# Patient Record
Sex: Male | Born: 1995
Health system: Southern US, Community
[De-identification: ages and names within clinical notes are randomized; demographics above are authoritative.]

## PROBLEM LIST (undated history)

## (undated) DIAGNOSIS — F909 Attention-deficit hyperactivity disorder, unspecified type: Secondary | ICD-10-CM

## (undated) DIAGNOSIS — F419 Anxiety disorder, unspecified: Secondary | ICD-10-CM

## (undated) DIAGNOSIS — L0100 Impetigo, unspecified: Secondary | ICD-10-CM

## (undated) DIAGNOSIS — L509 Urticaria, unspecified: Secondary | ICD-10-CM

## (undated) HISTORY — DX: Urticaria, unspecified: L50.9

## (undated) HISTORY — PX: TONSILLECTOMY: SUR1361

---
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
## 2016-08-16 DIAGNOSIS — F3340 Major depressive disorder, recurrent, in remission, unspecified: Secondary | ICD-10-CM | POA: Diagnosis not present

## 2016-08-16 DIAGNOSIS — F411 Generalized anxiety disorder: Secondary | ICD-10-CM | POA: Diagnosis not present

## 2016-09-10 DIAGNOSIS — R197 Diarrhea, unspecified: Secondary | ICD-10-CM | POA: Diagnosis not present

## 2016-09-10 DIAGNOSIS — R0982 Postnasal drip: Secondary | ICD-10-CM | POA: Diagnosis not present

## 2016-09-10 DIAGNOSIS — J111 Influenza due to unidentified influenza virus with other respiratory manifestations: Secondary | ICD-10-CM | POA: Diagnosis not present

## 2016-09-10 DIAGNOSIS — J069 Acute upper respiratory infection, unspecified: Secondary | ICD-10-CM | POA: Diagnosis not present

## 2016-09-20 DIAGNOSIS — F3340 Major depressive disorder, recurrent, in remission, unspecified: Secondary | ICD-10-CM | POA: Diagnosis not present

## 2016-09-20 DIAGNOSIS — F411 Generalized anxiety disorder: Secondary | ICD-10-CM | POA: Diagnosis not present

## 2016-09-24 DIAGNOSIS — F411 Generalized anxiety disorder: Secondary | ICD-10-CM | POA: Diagnosis not present

## 2016-09-24 DIAGNOSIS — F3340 Major depressive disorder, recurrent, in remission, unspecified: Secondary | ICD-10-CM | POA: Diagnosis not present

## 2016-10-01 DIAGNOSIS — F3340 Major depressive disorder, recurrent, in remission, unspecified: Secondary | ICD-10-CM | POA: Diagnosis not present

## 2016-10-01 DIAGNOSIS — F411 Generalized anxiety disorder: Secondary | ICD-10-CM | POA: Diagnosis not present

## 2016-10-05 DIAGNOSIS — R509 Fever, unspecified: Secondary | ICD-10-CM | POA: Diagnosis not present

## 2016-10-05 DIAGNOSIS — R109 Unspecified abdominal pain: Secondary | ICD-10-CM | POA: Diagnosis not present

## 2016-10-05 DIAGNOSIS — J069 Acute upper respiratory infection, unspecified: Secondary | ICD-10-CM | POA: Diagnosis not present

## 2016-10-15 DIAGNOSIS — F3340 Major depressive disorder, recurrent, in remission, unspecified: Secondary | ICD-10-CM | POA: Diagnosis not present

## 2016-10-15 DIAGNOSIS — F411 Generalized anxiety disorder: Secondary | ICD-10-CM | POA: Diagnosis not present

## 2016-10-27 DIAGNOSIS — F3170 Bipolar disorder, currently in remission, most recent episode unspecified: Secondary | ICD-10-CM | POA: Diagnosis not present

## 2016-10-27 DIAGNOSIS — Z79899 Other long term (current) drug therapy: Secondary | ICD-10-CM | POA: Diagnosis not present

## 2016-10-29 DIAGNOSIS — F3340 Major depressive disorder, recurrent, in remission, unspecified: Secondary | ICD-10-CM | POA: Diagnosis not present

## 2016-10-29 DIAGNOSIS — F411 Generalized anxiety disorder: Secondary | ICD-10-CM | POA: Diagnosis not present

## 2016-11-09 DIAGNOSIS — F3170 Bipolar disorder, currently in remission, most recent episode unspecified: Secondary | ICD-10-CM | POA: Diagnosis not present

## 2016-11-12 DIAGNOSIS — F3170 Bipolar disorder, currently in remission, most recent episode unspecified: Secondary | ICD-10-CM | POA: Diagnosis not present

## 2016-11-23 DIAGNOSIS — F3170 Bipolar disorder, currently in remission, most recent episode unspecified: Secondary | ICD-10-CM | POA: Diagnosis not present

## 2016-11-26 DIAGNOSIS — F3170 Bipolar disorder, currently in remission, most recent episode unspecified: Secondary | ICD-10-CM | POA: Diagnosis not present

## 2016-12-10 DIAGNOSIS — F3170 Bipolar disorder, currently in remission, most recent episode unspecified: Secondary | ICD-10-CM | POA: Diagnosis not present

## 2016-12-24 DIAGNOSIS — F3170 Bipolar disorder, currently in remission, most recent episode unspecified: Secondary | ICD-10-CM | POA: Diagnosis not present

## 2016-12-29 DIAGNOSIS — F3170 Bipolar disorder, currently in remission, most recent episode unspecified: Secondary | ICD-10-CM | POA: Diagnosis not present

## 2017-01-07 DIAGNOSIS — F3170 Bipolar disorder, currently in remission, most recent episode unspecified: Secondary | ICD-10-CM | POA: Diagnosis not present

## 2017-01-21 DIAGNOSIS — F3170 Bipolar disorder, currently in remission, most recent episode unspecified: Secondary | ICD-10-CM | POA: Diagnosis not present

## 2017-01-28 DIAGNOSIS — F3170 Bipolar disorder, currently in remission, most recent episode unspecified: Secondary | ICD-10-CM | POA: Diagnosis not present

## 2017-02-09 DIAGNOSIS — F3170 Bipolar disorder, currently in remission, most recent episode unspecified: Secondary | ICD-10-CM | POA: Diagnosis not present

## 2017-02-17 DIAGNOSIS — F3170 Bipolar disorder, currently in remission, most recent episode unspecified: Secondary | ICD-10-CM | POA: Diagnosis not present

## 2017-02-26 DIAGNOSIS — R0982 Postnasal drip: Secondary | ICD-10-CM | POA: Diagnosis not present

## 2017-02-26 DIAGNOSIS — R05 Cough: Secondary | ICD-10-CM | POA: Diagnosis not present

## 2017-02-26 DIAGNOSIS — J029 Acute pharyngitis, unspecified: Secondary | ICD-10-CM | POA: Diagnosis not present

## 2017-03-04 DIAGNOSIS — F3170 Bipolar disorder, currently in remission, most recent episode unspecified: Secondary | ICD-10-CM | POA: Diagnosis not present

## 2017-03-23 DIAGNOSIS — F3170 Bipolar disorder, currently in remission, most recent episode unspecified: Secondary | ICD-10-CM | POA: Diagnosis not present

## 2017-04-15 DIAGNOSIS — F3170 Bipolar disorder, currently in remission, most recent episode unspecified: Secondary | ICD-10-CM | POA: Diagnosis not present

## 2017-04-22 DIAGNOSIS — F3170 Bipolar disorder, currently in remission, most recent episode unspecified: Secondary | ICD-10-CM | POA: Diagnosis not present

## 2017-05-05 DIAGNOSIS — F3170 Bipolar disorder, currently in remission, most recent episode unspecified: Secondary | ICD-10-CM | POA: Diagnosis not present

## 2017-05-17 DIAGNOSIS — R05 Cough: Secondary | ICD-10-CM | POA: Diagnosis not present

## 2017-05-17 DIAGNOSIS — J029 Acute pharyngitis, unspecified: Secondary | ICD-10-CM | POA: Diagnosis not present

## 2017-05-17 DIAGNOSIS — F3170 Bipolar disorder, currently in remission, most recent episode unspecified: Secondary | ICD-10-CM | POA: Diagnosis not present

## 2017-06-01 DIAGNOSIS — F3170 Bipolar disorder, currently in remission, most recent episode unspecified: Secondary | ICD-10-CM | POA: Diagnosis not present

## 2017-06-03 DIAGNOSIS — F3170 Bipolar disorder, currently in remission, most recent episode unspecified: Secondary | ICD-10-CM | POA: Diagnosis not present

## 2017-06-17 DIAGNOSIS — F3170 Bipolar disorder, currently in remission, most recent episode unspecified: Secondary | ICD-10-CM | POA: Diagnosis not present

## 2017-07-01 DIAGNOSIS — F3170 Bipolar disorder, currently in remission, most recent episode unspecified: Secondary | ICD-10-CM | POA: Diagnosis not present

## 2017-07-15 DIAGNOSIS — F3170 Bipolar disorder, currently in remission, most recent episode unspecified: Secondary | ICD-10-CM | POA: Diagnosis not present

## 2017-07-25 DIAGNOSIS — F3170 Bipolar disorder, currently in remission, most recent episode unspecified: Secondary | ICD-10-CM | POA: Diagnosis not present

## 2017-08-12 DIAGNOSIS — F3170 Bipolar disorder, currently in remission, most recent episode unspecified: Secondary | ICD-10-CM | POA: Diagnosis not present

## 2017-10-06 DIAGNOSIS — F900 Attention-deficit hyperactivity disorder, predominantly inattentive type: Secondary | ICD-10-CM | POA: Diagnosis not present

## 2017-10-06 DIAGNOSIS — F3170 Bipolar disorder, currently in remission, most recent episode unspecified: Secondary | ICD-10-CM | POA: Diagnosis not present

## 2018-01-02 DIAGNOSIS — F900 Attention-deficit hyperactivity disorder, predominantly inattentive type: Secondary | ICD-10-CM | POA: Diagnosis not present

## 2018-01-02 DIAGNOSIS — F3170 Bipolar disorder, currently in remission, most recent episode unspecified: Secondary | ICD-10-CM | POA: Diagnosis not present

## 2018-01-09 DIAGNOSIS — F3170 Bipolar disorder, currently in remission, most recent episode unspecified: Secondary | ICD-10-CM | POA: Diagnosis not present

## 2018-01-09 DIAGNOSIS — F900 Attention-deficit hyperactivity disorder, predominantly inattentive type: Secondary | ICD-10-CM | POA: Diagnosis not present

## 2018-01-17 DIAGNOSIS — J029 Acute pharyngitis, unspecified: Secondary | ICD-10-CM | POA: Diagnosis not present

## 2018-01-17 DIAGNOSIS — R509 Fever, unspecified: Secondary | ICD-10-CM | POA: Diagnosis not present

## 2018-01-17 DIAGNOSIS — J3489 Other specified disorders of nose and nasal sinuses: Secondary | ICD-10-CM | POA: Diagnosis not present

## 2018-01-17 DIAGNOSIS — R05 Cough: Secondary | ICD-10-CM | POA: Diagnosis not present

## 2018-01-20 DIAGNOSIS — F3170 Bipolar disorder, currently in remission, most recent episode unspecified: Secondary | ICD-10-CM | POA: Diagnosis not present

## 2018-01-20 DIAGNOSIS — F900 Attention-deficit hyperactivity disorder, predominantly inattentive type: Secondary | ICD-10-CM | POA: Diagnosis not present

## 2018-01-23 DIAGNOSIS — F3170 Bipolar disorder, currently in remission, most recent episode unspecified: Secondary | ICD-10-CM | POA: Diagnosis not present

## 2018-01-23 DIAGNOSIS — F900 Attention-deficit hyperactivity disorder, predominantly inattentive type: Secondary | ICD-10-CM | POA: Diagnosis not present

## 2018-01-30 DIAGNOSIS — F900 Attention-deficit hyperactivity disorder, predominantly inattentive type: Secondary | ICD-10-CM | POA: Diagnosis not present

## 2018-01-30 DIAGNOSIS — F3170 Bipolar disorder, currently in remission, most recent episode unspecified: Secondary | ICD-10-CM | POA: Diagnosis not present

## 2018-02-03 DIAGNOSIS — F3170 Bipolar disorder, currently in remission, most recent episode unspecified: Secondary | ICD-10-CM | POA: Diagnosis not present

## 2018-02-03 DIAGNOSIS — F900 Attention-deficit hyperactivity disorder, predominantly inattentive type: Secondary | ICD-10-CM | POA: Diagnosis not present

## 2018-02-10 DIAGNOSIS — F3170 Bipolar disorder, currently in remission, most recent episode unspecified: Secondary | ICD-10-CM | POA: Diagnosis not present

## 2018-02-10 DIAGNOSIS — F900 Attention-deficit hyperactivity disorder, predominantly inattentive type: Secondary | ICD-10-CM | POA: Diagnosis not present

## 2018-02-17 DIAGNOSIS — F900 Attention-deficit hyperactivity disorder, predominantly inattentive type: Secondary | ICD-10-CM | POA: Diagnosis not present

## 2018-02-17 DIAGNOSIS — F3170 Bipolar disorder, currently in remission, most recent episode unspecified: Secondary | ICD-10-CM | POA: Diagnosis not present

## 2018-02-22 DIAGNOSIS — L237 Allergic contact dermatitis due to plants, except food: Secondary | ICD-10-CM | POA: Diagnosis not present

## 2018-02-22 DIAGNOSIS — L299 Pruritus, unspecified: Secondary | ICD-10-CM | POA: Diagnosis not present

## 2018-02-24 DIAGNOSIS — F3170 Bipolar disorder, currently in remission, most recent episode unspecified: Secondary | ICD-10-CM | POA: Diagnosis not present

## 2018-02-24 DIAGNOSIS — F900 Attention-deficit hyperactivity disorder, predominantly inattentive type: Secondary | ICD-10-CM | POA: Diagnosis not present

## 2018-03-03 DIAGNOSIS — F900 Attention-deficit hyperactivity disorder, predominantly inattentive type: Secondary | ICD-10-CM | POA: Diagnosis not present

## 2018-03-03 DIAGNOSIS — F3170 Bipolar disorder, currently in remission, most recent episode unspecified: Secondary | ICD-10-CM | POA: Diagnosis not present

## 2018-03-13 DIAGNOSIS — F3170 Bipolar disorder, currently in remission, most recent episode unspecified: Secondary | ICD-10-CM | POA: Diagnosis not present

## 2018-03-13 DIAGNOSIS — F900 Attention-deficit hyperactivity disorder, predominantly inattentive type: Secondary | ICD-10-CM | POA: Diagnosis not present

## 2018-03-22 NOTE — Progress Notes (Signed)
[facility] [address] [place], [place] [address] Phone: [phone] Subjective:     CC: Left knee pain  BJY:NWGNFAOZHY  Chad Chapman is a 22 y.o. male coming in with complaint of left knee pain. Has issues moving things up and downstairs recently when he was helping his sister move.  States that it had some swelling initially.  Has been having him for sometimes.  Works as a Administrator  Onset- 22 years old Location- Tibial tub. Character- Sore more of an aching sensation Aggravating factors- Long car rides  Severity-6 out of 10     Past Medical History:  Diagnosis Date  . ADHD (attention deficit hyperactivity disorder)   . Anxiety   . Impetigo 06/20/2013   Recent, now resolved.   No past surgical history on file. Social History   Socioeconomic History  . Marital status: Single    Spouse name: Not on file  . Number of children: Not on file  . Years of education: Not on file  . Highest education level: Not on file  Occupational History  . Not on file  Social Needs  . Financial resource strain: Not on file  . Food insecurity:    Worry: Not on file    Inability: Not on file  . Transportation needs:    Medical: Not on file    Non-medical: Not on file  Tobacco Use  . Smoking status: Former Smoker    Types: Cigarettes  . Smokeless tobacco: Never Used  Substance and Sexual Activity  . Alcohol use: Yes    Comment: drinks a glass of wine with father once  a month  . Drug use: No  . Sexual activity: Yes    Birth control/protection: Condom  Lifestyle  . Physical activity:    Days per week: Not on file    Minutes per session: Not on file  . Stress: Not on file  Relationships  . Social connections:    Talks on phone: Not on file    Gets together: Not on file    Attends religious service: Not on file    Active member of club or organization: Not on file    Attends meetings of clubs or organizations: Not on file    Relationship  status: Not on file  Other Topics Concern  . Not on file  Social History Narrative  . Not on file   No Known Allergies No family history on file.  No family history of autoimmune   Past medical history, social, surgical and family history all reviewed in electronic medical record.  No pertanent information unless stated regarding to the chief complaint.   Review of Systems:Review of systems updated and as accurate as of 03/23/18  No headache, visual changes, nausea, vomiting, diarrhea, constipation, dizziness, abdominal pain, skin rash, fevers, chills, night sweats, weight loss, swollen lymph nodes, body aches, joint swelling, muscle aches, chest pain, shortness of breath, mood changes.   Objective  Blood pressure 128/80, pulse 72, height 5\' 9"  (1.753 m), weight 150 lb (68 kg), SpO2 98 %. Systems examined below as of 03/23/18   General: No apparent distress alert and oriented x3 mood and affect normal, dressed appropriately.  HEENT: Pupils equal, extraocular movements intact  Respiratory: Patient's speak in full sentences and does not appear short of breath  Cardiovascular: No lower extremity edema, non tender, no erythema  Skin: Warm dry intact with no signs of infection or rash on extremities or on axial  skeleton.  Abdomen: Soft nontender  Neuro: Cranial nerves II through XII are intact, neurovascularly intact in all extremities with 2+ DTRs and 2+ pulses.  Lymph: No lymphadenopathy of posterior or anterior cervical chain or axillae bilaterally.  Gait normal with good balance and coordination.  MSK:  Non tender with full range of motion and good stability and symmetric strength and tone of shoulders, elbows, wrist, hip and ankles bilaterally.  Knee: Left Normal to inspection with no erythema or effusion or obvious bony abnormalities. Palpation shows tenderness to palpation over the patella and the tibial tuberosity ROM full in flexion and extension and lower leg rotation.  Mild  discomfort with flexion Ligaments with solid consistent endpoints including ACL, PCL, LCL, MCL. Negative Mcmurray's, Apley's, and Thessalonian tests. Mild painful patellar compression. Patellar glide without crepitus. Patellar and quadriceps tendons unremarkable. Hamstring and quadriceps strength is normal. Contralateral knee unremarkable  MSK US performed of: left knee This study was ordered, perfo rmed, and interpreted by Terrilee FilesZach Smith D.O.  Knee: Limited ultrasound shows that patient does have calcific changes over the tibial tuberosity with seems to be a nonhealing avulsion fracture noted.  No increase in Doppler flow and minimal hypoechoic changes   IMPRESSION: Patellar avulsion   Impression and Recommendations:     This case required medical decision making of moderate complexity.      Note: This dictation was prepared with Dragon dictation along with smaller phrase technology. Any transcriptional errors that result from this process are unintentional.

## 2018-03-23 ENCOUNTER — Ambulatory Visit: Payer: BLUE CROSS/BLUE SHIELD | Admitting: Family Medicine

## 2018-03-23 ENCOUNTER — Encounter: Payer: Self-pay | Admitting: Family Medicine

## 2018-03-23 ENCOUNTER — Ambulatory Visit: Payer: Self-pay

## 2018-03-23 VITALS — BP 128/80 | HR 72 | Ht 69.0 in | Wt 150.0 lb

## 2018-03-23 DIAGNOSIS — M25562 Pain in left knee: Secondary | ICD-10-CM | POA: Diagnosis not present

## 2018-03-23 DIAGNOSIS — S86892A Other injury of other muscle(s) and tendon(s) at lower leg level, left leg, initial encounter: Secondary | ICD-10-CM | POA: Diagnosis not present

## 2018-03-23 DIAGNOSIS — G8929 Other chronic pain: Secondary | ICD-10-CM | POA: Diagnosis not present

## 2018-03-23 MED ORDER — NITROGLYCERIN 0.2 MG/HR TD PT24: MEDICATED_PATCH | TRANSDERMAL | 1 refills | Status: DC

## 2018-03-23 MED ORDER — VITAMIN D (ERGOCALCIFEROL) 1.25 MG (50000 UNIT) PO CAPS: 50000.0000 [IU] | ORAL_CAPSULE | ORAL | 0 refills | Status: DC

## 2018-03-23 NOTE — Patient Instructions (Addendum)
Good to see you  Ice 20 minutes 2 times daily. Usually after activity and before bed. Patella strap daily and with working out.  Once weekly vitamin D Exercises 3 times a week.  Nitroglycerin Protocol   Apply 1/4 nitroglycerin patch to affected area daily.  Change position of patch within the affected area every 24 hours.  You may experience a headache during the first 1-2 weeks of using the patch, these should subside.  If you experience headaches after beginning nitroglycerin patch treatment, you may take your preferred over the counter pain reliever.  Another side effect of the nitroglycerin patch is skin irritation or rash related to patch adhesive.  Please notify our office if you develop more severe headaches or rash, and stop the patch.  Tendon healing with nitroglycerin patch may require 12 to 24 weeks depending on the extent of injury.  Men should not use if taking Viagra, Cialis, or Levitra.   Do not use if you have migraines or rosacea.  See me again in 4 weeks

## 2018-03-23 NOTE — Assessment & Plan Note (Signed)
Appears to be chronic.  We discussed bracing, once weekly vitamin D, nitroglycerin, home exercises.  Worsening symptoms we will consider further imaging.  I do not believe that that is necessary at this time.  Follow-up again in 4 weeks

## 2018-03-24 DIAGNOSIS — F3170 Bipolar disorder, currently in remission, most recent episode unspecified: Secondary | ICD-10-CM | POA: Diagnosis not present

## 2018-03-24 DIAGNOSIS — F900 Attention-deficit hyperactivity disorder, predominantly inattentive type: Secondary | ICD-10-CM | POA: Diagnosis not present

## 2018-03-31 DIAGNOSIS — F900 Attention-deficit hyperactivity disorder, predominantly inattentive type: Secondary | ICD-10-CM | POA: Diagnosis not present

## 2018-03-31 DIAGNOSIS — F3170 Bipolar disorder, currently in remission, most recent episode unspecified: Secondary | ICD-10-CM | POA: Diagnosis not present

## 2018-04-07 DIAGNOSIS — F900 Attention-deficit hyperactivity disorder, predominantly inattentive type: Secondary | ICD-10-CM | POA: Diagnosis not present

## 2018-04-07 DIAGNOSIS — F3170 Bipolar disorder, currently in remission, most recent episode unspecified: Secondary | ICD-10-CM | POA: Diagnosis not present

## 2018-04-14 DIAGNOSIS — F3170 Bipolar disorder, currently in remission, most recent episode unspecified: Secondary | ICD-10-CM | POA: Diagnosis not present

## 2018-04-14 DIAGNOSIS — F900 Attention-deficit hyperactivity disorder, predominantly inattentive type: Secondary | ICD-10-CM | POA: Diagnosis not present

## 2018-04-17 DIAGNOSIS — F3170 Bipolar disorder, currently in remission, most recent episode unspecified: Secondary | ICD-10-CM | POA: Diagnosis not present

## 2018-04-17 DIAGNOSIS — F900 Attention-deficit hyperactivity disorder, predominantly inattentive type: Secondary | ICD-10-CM | POA: Diagnosis not present

## 2018-04-17 NOTE — Progress Notes (Signed)
[facility] Ermelinda Eckert D.O. [facility] [address] [place], [place] [address] Phone: [phone] Subjective:      CC: Left knee pain follow-up  BJY:NWGNFAOZHYHPI:Subjective  Brett AlbinoRobert L Chapman is a 22 y.o. male coming in with complaint of left knee pain. States the nitroglycerin initially have him headaches but it has helped the pain. Still wears patella strap.   Patient states that he is feeling 90% better.  Has not been putting pressure on the knee at the moment.    Past Medical History:  Diagnosis Date  . ADHD (attention deficit hyperactivity disorder)   . Anxiety   . Impetigo 06/20/2013   Recent, now resolved.   No past surgical history on file. Social History   Socioeconomic History  . Marital status: Single    Spouse name: Not on file  . Number of children: Not on file  . Years of education: Not on file  . Highest education level: Not on file  Occupational History  . Not on file  Social Needs  . Financial resource strain: Not on file  . Food insecurity:    Worry: Not on file    Inability: Not on file  . Transportation needs:    Medical: Not on file    Non-medical: Not on file  Tobacco Use  . Smoking status: Former Smoker    Types: Cigarettes  . Smokeless tobacco: Never Used  Substance and Sexual Activity  . Alcohol use: Yes    Comment: drinks a glass of wine with father once  a month  . Drug use: No  . Sexual activity: Yes    Birth control/protection: Condom  Lifestyle  . Physical activity:    Days per week: Not on file    Minutes per session: Not on file  . Stress: Not on file  Relationships  . Social connections:    Talks on phone: Not on file    Gets together: Not on file    Attends religious service: Not on file    Active member of club or organization: Not on file    Attends meetings of clubs or organizations: Not on file    Relationship status: Not on file  Other Topics Concern  . Not on file  Social History Narrative  . Not on file   No Known  Allergies No family history on file.   Past medical history, social, surgical and family history all reviewed in electronic medical record.  No pertanent information unless stated regarding to the chief complaint.   Review of Systems:Review of systems updated and as accurate as of 04/17/18  No headache, visual changes, nausea, vomiting, diarrhea, constipation, dizziness, abdominal pain, skin rash, fevers, chills, night sweats, weight loss, swollen lymph nodes, body aches, joint swelling, muscle aches, chest pain, shortness of breath, mood changes.   Objective  There were no vitals taken for this visit. Systems examined below as of 04/17/18   General: No apparent distress alert and oriented x3 mood and affect normal, dressed appropriately.  HEENT: Pupils equal, extraocular movements intact  Respiratory: Patient's speak in full sentences and does not appear short of breath  Cardiovascular: No lower extremity edema, non tender, no erythema  Skin: Warm dry intact with no signs of infection or rash on extremities or on axial skeleton.  Abdomen: Soft nontender  Neuro: Cranial nerves II through XII are intact, neurovascularly intact in all extremities with 2+ DTRs and 2+ pulses.  Lymph: No lymphadenopathy of posterior or anterior cervical chain or  axillae bilaterally.  Gait normal with good balance and coordination.  MSK:  Non tender with full range of motion and good stability and symmetric strength and tone of shoulders, elbows, wrist, hip, and ankles bilaterally.  Knee: Left Normal to inspection with no erythema or effusion or obvious bony abnormalities. Palpation normal with no warmth, joint line tenderness, patellar tenderness, or condyle tenderness. ROM full in flexion and extension and lower leg rotation. Ligaments with solid consistent endpoints including ACL, PCL, LCL, MCL. Negative Mcmurray's, Apley's, and Thessalonian tests. Non painful patellar compression. Patellar glide without  crepitus. Patellar and quadriceps tendons unremarkable. Hamstring and quadriceps strength is normal. Contralateral knee unremarkable   Impression and Recommendations:     This case required medical decision making of moderate complexity.      Note: This dictation was prepared with Dragon dictation along with smaller phrase technology. Any transcriptional errors that result from this process are unintentional.

## 2018-04-19 ENCOUNTER — Encounter: Payer: Self-pay | Admitting: Family Medicine

## 2018-04-19 ENCOUNTER — Ambulatory Visit: Payer: BLUE CROSS/BLUE SHIELD | Admitting: Family Medicine

## 2018-04-19 DIAGNOSIS — S86892A Other injury of other muscle(s) and tendon(s) at lower leg level, left leg, initial encounter: Secondary | ICD-10-CM | POA: Diagnosis not present

## 2018-04-19 NOTE — Patient Instructions (Signed)
Good to see you  Chad Chapman is your friend.  Continue the brace with working out or when putting pressure on the knee Nitro for another 4 weeks, then 3 times a week for 2 weeks then stop  See em again in 6 weeks

## 2018-04-19 NOTE — Assessment & Plan Note (Signed)
Patient is doing relatively better at this time.  Discussed icing regimen and home exercise.  Discussed which activities to do which wants to avoid.  Follow-up again in 4 to 6 weeks

## 2018-05-12 DIAGNOSIS — F3170 Bipolar disorder, currently in remission, most recent episode unspecified: Secondary | ICD-10-CM | POA: Diagnosis not present

## 2018-05-12 DIAGNOSIS — F900 Attention-deficit hyperactivity disorder, predominantly inattentive type: Secondary | ICD-10-CM | POA: Diagnosis not present

## 2018-05-22 DIAGNOSIS — F3170 Bipolar disorder, currently in remission, most recent episode unspecified: Secondary | ICD-10-CM | POA: Diagnosis not present

## 2018-05-22 DIAGNOSIS — F900 Attention-deficit hyperactivity disorder, predominantly inattentive type: Secondary | ICD-10-CM | POA: Diagnosis not present

## 2018-05-23 DIAGNOSIS — R05 Cough: Secondary | ICD-10-CM | POA: Diagnosis not present

## 2018-05-23 DIAGNOSIS — J069 Acute upper respiratory infection, unspecified: Secondary | ICD-10-CM | POA: Diagnosis not present

## 2018-05-23 DIAGNOSIS — J029 Acute pharyngitis, unspecified: Secondary | ICD-10-CM | POA: Diagnosis not present

## 2018-05-29 DIAGNOSIS — F900 Attention-deficit hyperactivity disorder, predominantly inattentive type: Secondary | ICD-10-CM | POA: Diagnosis not present

## 2018-05-29 DIAGNOSIS — F3170 Bipolar disorder, currently in remission, most recent episode unspecified: Secondary | ICD-10-CM | POA: Diagnosis not present

## 2018-06-01 ENCOUNTER — Ambulatory Visit: Payer: BLUE CROSS/BLUE SHIELD | Admitting: Family Medicine

## 2018-06-01 ENCOUNTER — Encounter: Payer: Self-pay | Admitting: Family Medicine

## 2018-06-01 DIAGNOSIS — S86892A Other injury of other muscle(s) and tendon(s) at lower leg level, left leg, initial encounter: Secondary | ICD-10-CM | POA: Diagnosis not present

## 2018-06-01 NOTE — Assessment & Plan Note (Signed)
Well-healed at this time.  Minimal swelling still noted.  Discussed icing regimen and home exercises.  Discussed which activities of doing which wants to avoid.  Patient will increase activity as tolerated.  Follow-up with me again 4 to 8 weeks if worsening pain otherwise as needed

## 2018-06-01 NOTE — Progress Notes (Signed)
[facility] [address] [place], [place] [address] Phone: [phone] Subjective:   :    CC: Knee pain follow-up  BJY:NWGNFAOZHY  Chad Chapman is a 22 y.o. male coming in with complaint of left knee pain. He has been wearing brace with physical activity. He has not had any pain since last visit. Does note that he did not wear his brace one day to work and he had a "bumpy" knee. No increase in pain that day but he would like to know that he did not injure his knee by not wearing the brace.       Past Medical History:  Diagnosis Date  . ADHD (attention deficit hyperactivity disorder)   . Anxiety   . Impetigo 06/20/2013   Recent, now resolved.   No past surgical history on file. Social History   Socioeconomic History  . Marital status: Single    Spouse name: Not on file  . Number of children: Not on file  . Years of education: Not on file  . Highest education level: Not on file  Occupational History  . Not on file  Social Needs  . Financial resource strain: Not on file  . Food insecurity:    Worry: Not on file    Inability: Not on file  . Transportation needs:    Medical: Not on file    Non-medical: Not on file  Tobacco Use  . Smoking status: Former Smoker    Types: Cigarettes  . Smokeless tobacco: Never Used  Substance and Sexual Activity  . Alcohol use: Yes    Comment: drinks a glass of wine with father once  a month  . Drug use: No  . Sexual activity: Yes    Birth control/protection: Condom  Lifestyle  . Physical activity:    Days per week: Not on file    Minutes per session: Not on file  . Stress: Not on file  Relationships  . Social connections:    Talks on phone: Not on file    Gets together: Not on file    Attends religious service: Not on file    Active member of club or organization: Not on file    Attends meetings of clubs or organizations: Not on file    Relationship status: Not on file  Other Topics Concern    . Not on file  Social History Narrative  . Not on file   No Known Allergies No family history on file.   Current Outpatient Medications (Cardiovascular):  .  nitroGLYCERIN (NITRODUR - DOSED IN MG/24 HR) 0.2 mg/hr patch, 1/4 patch daily     Current Outpatient Medications (Other):  .  citalopram (CELEXA) 20 MG tablet, Take 1.5 tablets (30 mg total) by mouth at bedtime. .  Melatonin 5 MG TABS, Take 1 tablet (5 mg total) by mouth at bedtime. Patient may resume home supply. .  methylphenidate (CONCERTA) 36 MG CR tablet, Take 1 tablet (36 mg total) by mouth every morning. .  Vitamin D, Ergocalciferol, (DRISDOL) 50000 units CAPS capsule, Take 1 capsule (50,000 Units total) by mouth every 7 (seven) days.    Past medical history, social, surgical and family history all reviewed in electronic medical record.  No pertanent information unless stated regarding to the chief complaint.   Review of Systems:  No headache, visual changes, nausea, vomiting, diarrhea, constipation, dizziness, abdominal pain, skin rash, fevers, chills, night sweats, weight loss, swollen lymph nodes, body aches, joint swelling, muscle  aches, chest pain, shortness of breath, mood changes.   Objective  Blood pressure 98/72, pulse 63, height 5\' 9"  (1.753 m), weight 148 lb (67.1 kg), SpO2 98 %.    General: No apparent distress alert and oriented x3 mood and affect normal, dressed appropriately.  HEENT: Pupils equal, extraocular movements intact  Respiratory: Patient's speak in full sentences and does not appear short of breath  Cardiovascular: No lower extremity edema, non tender, no erythema  Skin: Warm dry intact with no signs of infection or rash on extremities or on axial skeleton.  Abdomen: Soft nontender  Neuro: Cranial nerves II through XII are intact, neurovascularly intact in all extremities with 2+ DTRs and 2+ pulses.  Lymph: No lymphadenopathy of posterior or anterior cervical chain or axillae bilaterally.   Gait normal with good balance and coordination.  MSK:  Non tender with full range of motion and good stability and symmetric strength and tone of shoulders, elbows, wrist, hip, and ankles bilaterally.  Knee: Left Normal to inspection with no erythema or effusion or obvious bony abnormalities.  Patient does have a bulge over the tibial tuberosity still noted but nontender Palpation normal with no warmth, joint line tenderness, patellar tenderness, or condyle tenderness. ROM full in flexion and extension and lower leg rotation. Ligaments with solid consistent endpoints including ACL, PCL, LCL, MCL. Negative Mcmurray's, Apley's, and Thessalonian tests. Non painful patellar compression. Patellar glide without crepitus. Patellar and quadriceps tendons unremarkable. Hamstring and quadriceps strength is normal. Contralateral knee unremarkable   Impression and Recommendations:     This case required medical decision making of moderate complexity. The above documentation has been reviewed and is accurate and complete Judi Saa, DO       Note: This dictation was prepared with Dragon dictation along with smaller phrase technology. Any transcriptional errors that result from this process are unintentional.

## 2018-06-05 DIAGNOSIS — F3170 Bipolar disorder, currently in remission, most recent episode unspecified: Secondary | ICD-10-CM | POA: Diagnosis not present

## 2018-06-05 DIAGNOSIS — F900 Attention-deficit hyperactivity disorder, predominantly inattentive type: Secondary | ICD-10-CM | POA: Diagnosis not present

## 2018-06-15 DIAGNOSIS — F900 Attention-deficit hyperactivity disorder, predominantly inattentive type: Secondary | ICD-10-CM | POA: Diagnosis not present

## 2018-06-15 DIAGNOSIS — F3170 Bipolar disorder, currently in remission, most recent episode unspecified: Secondary | ICD-10-CM | POA: Diagnosis not present

## 2018-06-19 DIAGNOSIS — F900 Attention-deficit hyperactivity disorder, predominantly inattentive type: Secondary | ICD-10-CM | POA: Diagnosis not present

## 2018-06-19 DIAGNOSIS — F3170 Bipolar disorder, currently in remission, most recent episode unspecified: Secondary | ICD-10-CM | POA: Diagnosis not present

## 2018-07-03 DIAGNOSIS — F900 Attention-deficit hyperactivity disorder, predominantly inattentive type: Secondary | ICD-10-CM | POA: Diagnosis not present

## 2018-07-03 DIAGNOSIS — F3170 Bipolar disorder, currently in remission, most recent episode unspecified: Secondary | ICD-10-CM | POA: Diagnosis not present

## 2018-07-17 DIAGNOSIS — F900 Attention-deficit hyperactivity disorder, predominantly inattentive type: Secondary | ICD-10-CM | POA: Diagnosis not present

## 2018-07-17 DIAGNOSIS — F3170 Bipolar disorder, currently in remission, most recent episode unspecified: Secondary | ICD-10-CM | POA: Diagnosis not present

## 2018-08-14 DIAGNOSIS — F900 Attention-deficit hyperactivity disorder, predominantly inattentive type: Secondary | ICD-10-CM | POA: Diagnosis not present

## 2018-08-14 DIAGNOSIS — F3170 Bipolar disorder, currently in remission, most recent episode unspecified: Secondary | ICD-10-CM | POA: Diagnosis not present

## 2018-10-09 DIAGNOSIS — F3170 Bipolar disorder, currently in remission, most recent episode unspecified: Secondary | ICD-10-CM | POA: Diagnosis not present

## 2018-10-09 DIAGNOSIS — F900 Attention-deficit hyperactivity disorder, predominantly inattentive type: Secondary | ICD-10-CM | POA: Diagnosis not present

## 2018-10-16 DIAGNOSIS — F900 Attention-deficit hyperactivity disorder, predominantly inattentive type: Secondary | ICD-10-CM | POA: Diagnosis not present

## 2018-10-16 DIAGNOSIS — F3170 Bipolar disorder, currently in remission, most recent episode unspecified: Secondary | ICD-10-CM | POA: Diagnosis not present

## 2018-10-23 DIAGNOSIS — F3170 Bipolar disorder, currently in remission, most recent episode unspecified: Secondary | ICD-10-CM | POA: Diagnosis not present

## 2018-10-23 DIAGNOSIS — F900 Attention-deficit hyperactivity disorder, predominantly inattentive type: Secondary | ICD-10-CM | POA: Diagnosis not present

## 2018-10-30 DIAGNOSIS — F3170 Bipolar disorder, currently in remission, most recent episode unspecified: Secondary | ICD-10-CM | POA: Diagnosis not present

## 2018-10-30 DIAGNOSIS — F900 Attention-deficit hyperactivity disorder, predominantly inattentive type: Secondary | ICD-10-CM | POA: Diagnosis not present

## 2018-11-06 DIAGNOSIS — F900 Attention-deficit hyperactivity disorder, predominantly inattentive type: Secondary | ICD-10-CM | POA: Diagnosis not present

## 2018-11-06 DIAGNOSIS — F3170 Bipolar disorder, currently in remission, most recent episode unspecified: Secondary | ICD-10-CM | POA: Diagnosis not present

## 2018-11-20 DIAGNOSIS — F3170 Bipolar disorder, currently in remission, most recent episode unspecified: Secondary | ICD-10-CM | POA: Diagnosis not present

## 2018-11-20 DIAGNOSIS — F900 Attention-deficit hyperactivity disorder, predominantly inattentive type: Secondary | ICD-10-CM | POA: Diagnosis not present

## 2018-11-27 DIAGNOSIS — F3170 Bipolar disorder, currently in remission, most recent episode unspecified: Secondary | ICD-10-CM | POA: Diagnosis not present

## 2018-11-27 DIAGNOSIS — F900 Attention-deficit hyperactivity disorder, predominantly inattentive type: Secondary | ICD-10-CM | POA: Diagnosis not present

## 2018-12-05 DIAGNOSIS — F900 Attention-deficit hyperactivity disorder, predominantly inattentive type: Secondary | ICD-10-CM | POA: Diagnosis not present

## 2018-12-05 DIAGNOSIS — F3170 Bipolar disorder, currently in remission, most recent episode unspecified: Secondary | ICD-10-CM | POA: Diagnosis not present

## 2018-12-11 DIAGNOSIS — F900 Attention-deficit hyperactivity disorder, predominantly inattentive type: Secondary | ICD-10-CM | POA: Diagnosis not present

## 2018-12-11 DIAGNOSIS — F3170 Bipolar disorder, currently in remission, most recent episode unspecified: Secondary | ICD-10-CM | POA: Diagnosis not present

## 2018-12-18 DIAGNOSIS — F3170 Bipolar disorder, currently in remission, most recent episode unspecified: Secondary | ICD-10-CM | POA: Diagnosis not present

## 2018-12-18 DIAGNOSIS — F900 Attention-deficit hyperactivity disorder, predominantly inattentive type: Secondary | ICD-10-CM | POA: Diagnosis not present

## 2018-12-25 DIAGNOSIS — F3170 Bipolar disorder, currently in remission, most recent episode unspecified: Secondary | ICD-10-CM | POA: Diagnosis not present

## 2018-12-25 DIAGNOSIS — F900 Attention-deficit hyperactivity disorder, predominantly inattentive type: Secondary | ICD-10-CM | POA: Diagnosis not present

## 2019-01-01 DIAGNOSIS — F3170 Bipolar disorder, currently in remission, most recent episode unspecified: Secondary | ICD-10-CM | POA: Diagnosis not present

## 2019-01-02 DIAGNOSIS — F3170 Bipolar disorder, currently in remission, most recent episode unspecified: Secondary | ICD-10-CM | POA: Diagnosis not present

## 2019-02-22 DIAGNOSIS — L259 Unspecified contact dermatitis, unspecified cause: Secondary | ICD-10-CM | POA: Diagnosis not present

## 2019-08-29 DIAGNOSIS — L309 Dermatitis, unspecified: Secondary | ICD-10-CM | POA: Diagnosis not present

## 2019-11-07 DIAGNOSIS — Z23 Encounter for immunization: Secondary | ICD-10-CM | POA: Diagnosis not present

## 2019-11-07 DIAGNOSIS — Z0000 Encounter for general adult medical examination without abnormal findings: Secondary | ICD-10-CM | POA: Diagnosis not present

## 2019-11-07 DIAGNOSIS — Z13220 Encounter for screening for lipoid disorders: Secondary | ICD-10-CM | POA: Diagnosis not present

## 2019-11-22 DIAGNOSIS — D225 Melanocytic nevi of trunk: Secondary | ICD-10-CM | POA: Diagnosis not present

## 2019-11-28 ENCOUNTER — Other Ambulatory Visit: Payer: Self-pay

## 2019-11-28 ENCOUNTER — Encounter: Payer: Self-pay | Admitting: Allergy

## 2019-11-28 ENCOUNTER — Ambulatory Visit (INDEPENDENT_AMBULATORY_CARE_PROVIDER_SITE_OTHER): Payer: BC Managed Care – PPO | Admitting: Allergy

## 2019-11-28 VITALS — BP 140/100 | HR 78 | Temp 97.8°F | Resp 16 | Ht 72.0 in | Wt 161.2 lb

## 2019-11-28 DIAGNOSIS — J310 Chronic rhinitis: Secondary | ICD-10-CM

## 2019-11-28 DIAGNOSIS — L508 Other urticaria: Secondary | ICD-10-CM

## 2019-11-28 NOTE — Patient Instructions (Addendum)
Chronic hives  - at this time etiology of hives and swelling is unknown.  Hives can be caused by a variety of different triggers including illness/infection, foods, medications, stings, exercise, pressure, vibrations, extremes of temperature to name a few however majority of the time there is no identifiable trigger.  Your symptoms have been ongoing for >6 weeks making this chronic thus will obtain labwork to evaluate: CBC w diff, CMP, tryptase, hive panel, environmental panel, alpha-gal panel  - for management of hives recommend starting the following antihistamine regimen: Zyrtec 10mg  1 tab twice a day with Pepcid 20mg  1 tab twice a day  - if you still have hives despite antihistamines above then will add in Singulair.  If you still have hives then will discuss further Xolair management.  Xolair is a monthly injection done to help control hives that are not responding well to antihistamine regimen.      Rhinitis  -Will use antihistamines as above  -Obtain environmental allergy panel as above   Follow-up 2-3 months or sooner if needed

## 2019-11-28 NOTE — Progress Notes (Signed)
New Patient Note  RE: Chad Chapman MRN: [id number] DOB: 03-26-1996 Date of Office Visit: 11/28/2019  Referring provider: Gaynelle Arabian, MD Primary care provider: Gaynelle Arabian, MD  Chief Complaint: Hives  History of present illness: Chad Chapman is a 24 y.o. male presenting today for consultation for hives.  He has been having hives since around the second week of Dec 2020.   He states prior to Dec 2020 he has had breakouts and hives related to poison ivy contact.    He states the hives has been pretty much daily since they started.  He states he usually will notice them more around evening.  He has been staying more at his girlfriends apartment currently.  He states he noticed when he is at his mother's house that he does have more hives appear.  He is not sure what he is reactive to at his mother's home.  There are 2 dogs at his mom's house however he states he previously had been around these dogs for over 5 years without any issues.  He does state his mom's house is a dusty.  He goes there about 1-2 times a week.  He currently stays with his girlfriend.  Hives are itchy.  Hives present for about 15 minutes or so individually before resolving.  Hives can appear anywhere on the body.  Hives do not leave any marks/bruising once resolved.  He has had no fevers, swelling, joint aches/pains with the hives.  No preceding illnesses.  No change to foods in his diet.  He does not take any medications on a regular basis.  He denies having any stings or bites.  He states he did change his detergents after he developed the hives but states that that did not make a difference.  He does eat red meat in the diet.  He has had tick bites when he was child. He initially thought he had bad marijuana as it was brown.  But he has reduced his marijuana intake and that also has not made a difference.  He is also noticed that the itching and hives do seem to be a little bit worse if he gets too hot like  taking a hot shower.  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  He also denies any pressure induced symptoms.  He has tried benadryl to help with the rash.  HC does help with the itch.    He does report some nasal drainage during spring time but states he usually just toughs it out.  He has not taken any medications for any allergy symptom control in the past. He reports that he limits his dairy intake as it can worsen his acne.  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  He states when he strokes the skin with a scratch that it will well.  Review of systems: Review of Systems  Constitutional: Negative.   HENT: Negative.   Eyes: Negative.   Respiratory: Negative.   Cardiovascular: Negative.   Gastrointestinal: Negative.   Musculoskeletal: Negative.   Skin: Positive for itching and rash.  Neurological: Negative.     All other systems negative unless noted above in HPI  Past medical history: Past Medical History:  Diagnosis Date  . ADHD (attention deficit hyperactivity disorder)   . Anxiety   . Impetigo 06/20/2013   Recent, now resolved.  . Urticaria     Past surgical history: History reviewed. No  pertinent surgical history.  Family history:  Family History  Problem Relation Age of Onset  . Hypertension Mother   . Healthy Father     Social history: Social History   Socioeconomic History  . Marital status: Single    Spouse name: Not on file  . Number of children: Not on file  . Years of education: Not on file  . Highest education level: Not on file  Occupational History  . Not on file  Tobacco Use  . Smoking status: Former Smoker    Types: Cigarettes  . Smokeless tobacco: Never Used  Substance and Sexual Activity  . Alcohol use: Yes    Comment: drinks a glass of wine with father once  a month  . Drug use: Yes    Types: Marijuana  . Sexual activity: Yes    Birth control/protection: Condom  Other Topics  Concern  . Not on file  Social History Narrative  . Not on file   Social Determinants of Health   Financial Resource Strain:   . Difficulty of Paying Living Expenses:   Food Insecurity:   . Worried About Programme researcher, broadcasting/film/video in the Last Year:   . Barista in the Last Year:   Transportation Needs:   . Freight forwarder (Medical):   Marland Kitchen Lack of Transportation (Non-Medical):   Physical Activity:   . Days of Exercise per Week:   . Minutes of Exercise per Session:   Stress:   . Feeling of Stress :   Social Connections:   . Frequency of Communication with Friends and Family:   . Frequency of Social Gatherings with Friends and Family:   . Attends Religious Services:   . Active Member of Clubs or Organizations:   . Attends Banker Meetings:   Marland Kitchen Marital Status:   Intimate Partner Violence:   . Fear of Current or Ex-Partner:   . Emotionally Abused:   Marland Kitchen Physically Abused:   . Sexually Abused:     Medication List: Current Outpatient Medications  Medication Sig Dispense Refill  . diphenhydrAMINE (BENADRYL ALLERGY) 25 MG tablet Take 25 mg by mouth every 6 (six) hours as needed.     No current facility-administered medications for this visit.    Known medication allergies: No Known Allergies   Physical examination: Blood pressure (!) 140/100, pulse 78, temperature 97.8 F (36.6 C), temperature source Temporal, resp. rate 16, height 6' (1.829 m), weight 161 lb 3.2 oz (73.1 kg), SpO2 98 %.  General: Alert, interactive, in no acute distress. HEENT: PERRLA, TMs pearly gray, turbinates non-edematous without discharge, post-pharynx non erythematous. Neck: Supple without lymphadenopathy. Lungs: Clear to auscultation without wheezing, rhonchi or rales. {no increased work of breathing. CV: Normal S1, S2 without murmurs. Abdomen: Nondistended, nontender. Skin: Scattered erythematous urticarial type lesions primarily located Left forearm ,  nonvesicular. Extremities:  No clubbing, cyanosis or edema. Neuro:   Grossly intact.  Diagnositics/Labs:  Allergy testing: Deferred due to ongoing urticaria and history of dermatographia   Assessment and plan:   Chronic urticaria  - at this time etiology of hives and swelling is unknown.  Hives can be caused by a variety of different triggers including illness/infection, foods, medications, stings, exercise, pressure, vibrations, extremes of temperature to name a few however majority of the time there is no identifiable trigger.  Your symptoms have been ongoing for >6 weeks making this chronic thus will obtain labwork to evaluate: CBC w diff, CMP, tryptase, hive panel, environmental panel,  alpha-gal panel  - for management of hives recommend starting the following antihistamine regimen: Zyrtec 10mg  1 tab twice a day with Pepcid 20mg  1 tab twice a day  - if you still have hives despite antihistamines above then will add in Singulair.  If you still have hives then will discuss further Xolair management.  Xolair is a monthly injection done to help control hives that are not responding well to antihistamine regimen.      Rhinitis  -Will use antihistamines as above  -Obtain environmental allergy panel as above  Follow-up 2-3 months or sooner if needed    I appreciate the opportunity to take part in Jaydyn's care. Please do not hesitate to contact me with questions.  Sincerely,   , MD Allergy/Immunology [facility]

## 2019-12-05 LAB — COMPREHENSIVE METABOLIC PANEL
ALT: 25 IU/L (ref 0–44)
AST: 30 IU/L (ref 0–40)
Albumin/Globulin Ratio: 2 (ref 1.2–2.2)
Albumin: 5.3 g/dL — ABNORMAL HIGH (ref 4.1–5.2)
Alkaline Phosphatase: 85 IU/L (ref 39–117)
BUN/Creatinine Ratio: 15 (ref 9–20)
BUN: 12 mg/dL (ref 6–20)
Bilirubin Total: 0.5 mg/dL (ref 0.0–1.2)
CO2: 25 mmol/L (ref 20–29)
Calcium: 10.4 mg/dL — ABNORMAL HIGH (ref 8.7–10.2)
Chloride: 101 mmol/L (ref 96–106)
Creatinine, Ser: 0.81 mg/dL (ref 0.76–1.27)
GFR calc Af Amer: 145 mL/min/{1.73_m2} (ref >59)
GFR calc non Af Amer: 125 mL/min/{1.73_m2} (ref >59)
Globulin, Total: 2.7 g/dL (ref 1.5–4.5)
Glucose: 107 mg/dL — ABNORMAL HIGH (ref 65–99)
Potassium: 4 mmol/L (ref 3.5–5.2)
Sodium: 139 mmol/L (ref 134–144)
Total Protein: 8 g/dL (ref 6.0–8.5)

## 2019-12-05 LAB — ALLERGENS W/TOTAL IGE AREA 2
Alternaria Alternata IgE: <0.1 kU/L
Aspergillus Fumigatus IgE: <0.1 kU/L
Bermuda Grass IgE: <0.1 kU/L
Cat Dander IgE: <0.1 kU/L
Cedar, Mountain IgE: <0.1 kU/L
Cladosporium Herbarum IgE: <0.1 kU/L
Cockroach, German IgE: <0.1 kU/L
Common Silver Birch IgE: <0.1 kU/L
Cottonwood IgE: <0.1 kU/L
D Farinae IgE: 0.16 kU/L — AB
D Pteronyssinus IgE: 0.19 kU/L — AB
Dog Dander IgE: <0.1 kU/L
Elm, American IgE: <0.1 kU/L
IgE (Immunoglobulin E), Serum: 47 IU/mL (ref 6–495)
Johnson Grass IgE: <0.1 kU/L
Maple/Box Elder IgE: <0.1 kU/L
Mouse Urine IgE: <0.1 kU/L
Oak, White IgE: <0.1 kU/L
Pecan, Hickory IgE: <0.1 kU/L
Penicillium Chrysogen IgE: <0.1 kU/L
Pigweed, Rough IgE: <0.1 kU/L
Ragweed, Short IgE: <0.1 kU/L
Sheep Sorrel IgE Qn: <0.1 kU/L
Timothy Grass IgE: <0.1 kU/L
White Mulberry IgE: <0.1 kU/L

## 2019-12-05 LAB — CBC WITH DIFFERENTIAL/PLATELET
Basophils Absolute: 0.1 10*3/uL (ref 0.0–0.2)
Basos: 1 %
EOS (ABSOLUTE): 0.1 10*3/uL (ref 0.0–0.4)
Eos: 1 %
Hematocrit: 45.6 % (ref 37.5–51.0)
Hemoglobin: 15.5 g/dL (ref 13.0–17.7)
Immature Grans (Abs): 0 10*3/uL (ref 0.0–0.1)
Immature Granulocytes: 0 %
Lymphocytes Absolute: 2.9 10*3/uL (ref 0.7–3.1)
Lymphs: 27 %
MCH: 30.6 pg (ref 26.6–33.0)
MCHC: 34 g/dL (ref 31.5–35.7)
MCV: 90 fL (ref 79–97)
Monocytes Absolute: 0.7 10*3/uL (ref 0.1–0.9)
Monocytes: 7 %
Neutrophils Absolute: 6.9 10*3/uL (ref 1.4–7.0)
Neutrophils: 64 %
Platelets: 471 10*3/uL — ABNORMAL HIGH (ref 150–450)
RBC: 5.07 x10E6/uL (ref 4.14–5.80)
RDW: 12 % (ref 11.6–15.4)
WBC: 10.7 10*3/uL (ref 3.4–10.8)

## 2019-12-05 LAB — ALPHA-GAL PANEL
Alpha Gal IgE*: <0.1 kU/L (ref <0.10)
Beef (Bos spp) IgE: <0.1 kU/L (ref <0.35)
Class Interpretation: 0
Class Interpretation: 0
Class Interpretation: 0
Lamb/Mutton (Ovis spp) IgE: <0.1 kU/L (ref <0.35)
Pork (Sus spp) IgE: <0.1 kU/L (ref <0.35)

## 2019-12-05 LAB — THYROID ANTIBODIES
Thyroglobulin Antibody: <1 IU/mL (ref 0.0–0.9)
Thyroperoxidase Ab SerPl-aCnc: <9 IU/mL (ref 0–34)

## 2019-12-05 LAB — TRYPTASE: Tryptase: 4.3 ug/L (ref 2.2–13.2)

## 2019-12-05 LAB — CHRONIC URTICARIA: cu index: 8.3 (ref <10)

## 2020-02-20 ENCOUNTER — Ambulatory Visit: Payer: BC Managed Care – PPO | Admitting: Allergy

## 2020-05-07 DIAGNOSIS — Z20828 Contact with and (suspected) exposure to other viral communicable diseases: Secondary | ICD-10-CM | POA: Diagnosis not present

## 2022-11-16 ENCOUNTER — Encounter (HOSPITAL_COMMUNITY): Payer: Self-pay | Admitting: *Deleted

## 2022-11-16 ENCOUNTER — Ambulatory Visit (HOSPITAL_COMMUNITY)
Admission: EM | Admit: 2022-11-16 | Discharge: 2022-11-16 | Disposition: A | Discharge status: Home / Self Care | Payer: BLUE CROSS/BLUE SHIELD | Attending: Family Medicine | Admitting: Family Medicine

## 2022-11-16 DIAGNOSIS — Z202 Contact with and (suspected) exposure to infections with a predominantly sexual mode of transmission: Secondary | ICD-10-CM

## 2022-11-16 NOTE — ED Triage Notes (Signed)
Pt states he is here for STI testing he was exposed to genital herpes a couple weeks ago. He denies any sx.

## 2022-11-16 NOTE — ED Provider Notes (Signed)
  [place]   [id number] 11/16/22 Arrival Time: [id number]  ASSESSMENT & PLAN:  1. STD exposure    Reports paying case for today's visit. Declines Aptima swab. Discussed limited utility in serum HSV testing. He is comfortable with observation.   Follow-up Information     Gaynelle Arabian, MD.   Specialty: Family Medicine Why: As needed. Contact information: [address] [place] [address] [phone]                  Reviewed expectations re: course of current medical issues. Questions answered. Outlined signs and symptoms indicating need for more acute intervention. Patient verbalized understanding. After Visit Summary given.   SUBJECTIVE:  Chad Chapman is a 27 y.o. male who reports possible exposure to HSV; 2 w ago; male partner; unprotected sex. He is asymptomatic.  OBJECTIVE:  Vitals:   11/16/22 1321  BP: (!) 166/98  Pulse: 85  Resp: 18  Temp: 98.7 F (37.1 C)  TempSrc: Oral  SpO2: 99%     General appearance: alert, cooperative, appears stated age and no distress GU: deferred Skin: warm and dry Psychological: alert and cooperative; normal mood and affect.  No Known Allergies  Past Medical History:  Diagnosis Date   ADHD (attention deficit hyperactivity disorder)    Anxiety    Impetigo 06/20/2013   Recent, now resolved.   Urticaria    Family History  Problem Relation Age of Onset   Hypertension Mother    Healthy Father    Social History   Socioeconomic History   Marital status: Single    Spouse name: Not on file   Number of children: Not on file   Years of education: Not on file   Highest education level: Not on file  Occupational History   Not on file  Tobacco Use   Smoking status: Every Day    Types: Cigarettes   Smokeless tobacco: Never  Vaping Use   Vaping Use: Every day  Substance and Sexual Activity   Alcohol use: Yes    Alcohol/week: 14.0 standard drinks of alcohol    Types: 14 Cans  of beer per week   Drug use: Yes    Types: Marijuana, Cocaine    Comment: no cocaine since 11/23   Sexual activity: Yes    Birth control/protection: Condom, None  Other Topics Concern   Not on file  Social History Narrative   Not on file   Social Determinants of Health   Financial Resource Strain: Not on file  Food Insecurity: Not on file  Transportation Needs: Not on file  Physical Activity: Not on file  Stress: Not on file  Social Connections: Not on file  Intimate Partner Violence: Not on file           [place], MD 11/16/22 1345

## 2022-11-29 ENCOUNTER — Other Ambulatory Visit: Payer: Self-pay

## 2022-11-29 DIAGNOSIS — R1011 Right upper quadrant pain: Secondary | ICD-10-CM | POA: Insufficient documentation

## 2022-11-29 DIAGNOSIS — F17210 Nicotine dependence, cigarettes, uncomplicated: Secondary | ICD-10-CM | POA: Insufficient documentation

## 2022-11-29 NOTE — ED Triage Notes (Signed)
POV from home, A&O x 4, GCS 15, amb to triage  Sts that about 6 days ago he noticed lump on upper right side of abd, sts he lifts heavy things at work everyday and thinks it could be a hernia, denies NVD

## 2022-11-30 ENCOUNTER — Emergency Department (HOSPITAL_BASED_OUTPATIENT_CLINIC_OR_DEPARTMENT_OTHER)
Admission: EM | Admit: 2022-11-30 | Discharge: 2022-11-30 | Disposition: A | Discharge status: Home / Self Care | Payer: Self-pay | Attending: Emergency Medicine | Admitting: Emergency Medicine

## 2022-11-30 ENCOUNTER — Emergency Department (HOSPITAL_BASED_OUTPATIENT_CLINIC_OR_DEPARTMENT_OTHER): Payer: Self-pay

## 2022-11-30 DIAGNOSIS — R1011 Right upper quadrant pain: Secondary | ICD-10-CM

## 2022-11-30 LAB — CBC WITH DIFFERENTIAL/PLATELET
Abs Immature Granulocytes: 0.02 10*3/uL (ref 0.00–0.07)
Basophils Absolute: 0.1 10*3/uL (ref 0.0–0.1)
Basophils Relative: 1 %
Eosinophils Absolute: 0.5 10*3/uL (ref 0.0–0.5)
Eosinophils Relative: 4 %
HCT: 38.3 % — ABNORMAL LOW (ref 39.0–52.0)
Hemoglobin: 13.3 g/dL (ref 13.0–17.0)
Immature Granulocytes: 0 %
Lymphocytes Relative: 33 %
Lymphs Abs: 4.3 10*3/uL — ABNORMAL HIGH (ref 0.7–4.0)
MCH: 31.4 pg (ref 26.0–34.0)
MCHC: 34.7 g/dL (ref 30.0–36.0)
MCV: 90.3 fL (ref 80.0–100.0)
Monocytes Absolute: 0.8 10*3/uL (ref 0.1–1.0)
Monocytes Relative: 6 %
Neutro Abs: 7.2 10*3/uL (ref 1.7–7.7)
Neutrophils Relative %: 56 %
Platelets: 378 10*3/uL (ref 150–400)
RBC: 4.24 MIL/uL (ref 4.22–5.81)
RDW: 11.9 % (ref 11.5–15.5)
WBC: 12.8 10*3/uL — ABNORMAL HIGH (ref 4.0–10.5)
nRBC: 0 % (ref 0.0–0.2)

## 2022-11-30 LAB — COMPREHENSIVE METABOLIC PANEL
ALT: 17 U/L (ref 0–44)
AST: 19 U/L (ref 15–41)
Albumin: 4.4 g/dL (ref 3.5–5.0)
Alkaline Phosphatase: 57 U/L (ref 38–126)
Anion gap: 9 (ref 5–15)
BUN: 17 mg/dL (ref 6–20)
CO2: 26 mmol/L (ref 22–32)
Calcium: 10 mg/dL (ref 8.9–10.3)
Chloride: 105 mmol/L (ref 98–111)
Creatinine, Ser: 1.05 mg/dL (ref 0.61–1.24)
GFR, Estimated: >60 mL/min (ref >60)
Glucose, Bld: 95 mg/dL (ref 70–99)
Potassium: 3.8 mmol/L (ref 3.5–5.1)
Sodium: 140 mmol/L (ref 135–145)
Total Bilirubin: 0.4 mg/dL (ref 0.3–1.2)
Total Protein: 7.3 g/dL (ref 6.5–8.1)

## 2022-11-30 LAB — LIPASE, BLOOD: Lipase: 14 U/L (ref 11–51)

## 2022-11-30 LAB — URINALYSIS, ROUTINE W REFLEX MICROSCOPIC
Bilirubin Urine: NEGATIVE
Glucose, UA: NEGATIVE mg/dL
Hgb urine dipstick: NEGATIVE
Leukocytes,Ua: NEGATIVE
Nitrite: NEGATIVE
Protein, ur: NEGATIVE mg/dL
Specific Gravity, Urine: 1.027 (ref 1.005–1.030)
pH: 6 (ref 5.0–8.0)

## 2022-11-30 MED ORDER — ONDANSETRON HCL 4 MG/2ML IJ SOLN
4.0000 mg | Freq: Once | INTRAMUSCULAR | Status: AC
  Administered 2022-11-30: 4 mg via INTRAVENOUS
  Filled 2022-11-30: qty 2

## 2022-11-30 MED ORDER — IOHEXOL 350 MG/ML SOLN
100.0000 mL | Freq: Once | INTRAVENOUS | Status: AC | PRN
  Administered 2022-11-30: 75 mL via INTRAVENOUS

## 2022-11-30 NOTE — ED Provider Notes (Signed)
[facility] Provider Note: Chad Spurling, MD, FACEP  CSN: [id number] MRN: [id number] ARRIVAL: 11/29/22 at 2239 ROOM: [place]  Abdominal Pain   HISTORY OF PRESENT ILLNESS  11/30/22 1:58 AM Chad Chapman is a 27 y.o. male with 6 days of right upper quadrant abdominal pain and pain beneath his right lower anterior ribs.  The pain has been worsening and he now rates the pain as a 9 out of 10.  He has had nausea but no vomiting or diarrhea.  Pain is worse with movement or palpation.   Past Medical History:  Diagnosis Date   ADHD (attention deficit hyperactivity disorder)    Anxiety    Impetigo 06/20/2013   Recent, now resolved.   Urticaria     No past surgical history on file.  Family History  Problem Relation Age of Onset   Hypertension Mother    Healthy Father     Social History   Tobacco Use   Smoking status: Every Day    Types: Cigarettes   Smokeless tobacco: Never  Vaping Use   Vaping Use: Every day  Substance Use Topics   Alcohol use: Yes    Alcohol/week: 14.0 standard drinks of alcohol    Types: 14 Cans of beer per week   Drug use: Yes    Types: Marijuana, Cocaine    Comment: no cocaine since 11/23    Prior to Admission medications   Medication Sig Start Date End Date Taking? Authorizing Provider  diphenhydrAMINE (BENADRYL ALLERGY) 25 MG tablet Take 25 mg by mouth every 6 (six) hours as needed.    [provider]    Allergies Patient has no known allergies.   REVIEW OF SYSTEMS  Negative except as noted here or in the History of Present Illness.   PHYSICAL EXAMINATION  Initial Vital Signs Blood pressure (!) 137/99, pulse 81, temperature 98.4 F (36.9 C), resp. rate 18, height 6' (1.829 m), weight 60.3 kg, SpO2 100 %.  Examination General: Well-developed, thin male in no acute distress; appearance consistent with age of record HENT: normocephalic; atraumatic Eyes: Normal appearance Neck: supple Heart:  regular rate and rhythm Lungs: clear to auscultation bilaterally Chest: Right lower anterior rib tenderness Abdomen: soft; nondistended; right upper quadrant tenderness; bowel sounds present Extremities: No deformity; full range of motion; pulses normal Neurologic: Awake, alert and oriented; motor function intact in all extremities and symmetric; no facial droop Skin: Warm and dry Psychiatric: Normal mood and affect   RESULTS  Summary of this visit's results, reviewed and interpreted by myself:   EKG Interpretation  Date/Time:    Ventricular Rate:    PR Interval:    QRS Duration:   QT Interval:    QTC Calculation:   R Axis:     Text Interpretation:         Laboratory Studies: Results for orders placed or performed during the hospital encounter of 11/30/22 (from the past 24 hour(s))  Lipase, blood     Status: None   Collection Time: 11/30/22  2:12 AM  Result Value Ref Range   Lipase 14 11 - 51 U/L  Comprehensive metabolic panel     Status: None   Collection Time: 11/30/22  2:12 AM  Result Value Ref Range   Sodium 140 135 - 145 mmol/L   Potassium 3.8 3.5 - 5.1 mmol/L   Chloride 105 98 - 111 mmol/L   CO2 26 22 - 32 mmol/L   Glucose, Bld 95 70 -  99 mg/dL   BUN 17 6 - 20 mg/dL   Creatinine, Ser 1.05 0.61 - 1.24 mg/dL   Calcium 10.0 8.9 - 10.3 mg/dL   Total Protein 7.3 6.5 - 8.1 g/dL   Albumin 4.4 3.5 - 5.0 g/dL   AST 19 15 - 41 U/L   ALT 17 0 - 44 U/L   Alkaline Phosphatase 57 38 - 126 U/L   Total Bilirubin 0.4 0.3 - 1.2 mg/dL   GFR, Estimated >60 >60 mL/min   Anion gap 9 5 - 15  Urinalysis, Routine w reflex microscopic -Urine, Clean Catch     Status: Abnormal   Collection Time: 11/30/22  2:12 AM  Result Value Ref Range   Color, Urine YELLOW YELLOW   APPearance CLEAR CLEAR   Specific Gravity, Urine 1.027 1.005 - 1.030   pH 6.0 5.0 - 8.0   Glucose, UA NEGATIVE NEGATIVE mg/dL   Hgb urine dipstick NEGATIVE NEGATIVE   Bilirubin Urine NEGATIVE NEGATIVE   Ketones,  ur TRACE (A) NEGATIVE mg/dL   Protein, ur NEGATIVE NEGATIVE mg/dL   Nitrite NEGATIVE NEGATIVE   Leukocytes,Ua NEGATIVE NEGATIVE  CBC with Differential     Status: Abnormal   Collection Time: 11/30/22  2:12 AM  Result Value Ref Range   WBC 12.8 (H) 4.0 - 10.5 K/uL   RBC 4.24 4.22 - 5.81 MIL/uL   Hemoglobin 13.3 13.0 - 17.0 g/dL   HCT 38.3 (L) 39.0 - 52.0 %   MCV 90.3 80.0 - 100.0 fL   MCH 31.4 26.0 - 34.0 pg   MCHC 34.7 30.0 - 36.0 g/dL   RDW 11.9 11.5 - 15.5 %   Platelets 378 150 - 400 K/uL   nRBC 0.0 0.0 - 0.2 %   Neutrophils Relative % 56 %   Neutro Abs 7.2 1.7 - 7.7 K/uL   Lymphocytes Relative 33 %   Lymphs Abs 4.3 (H) 0.7 - 4.0 K/uL   Monocytes Relative 6 %   Monocytes Absolute 0.8 0.1 - 1.0 K/uL   Eosinophils Relative 4 %   Eosinophils Absolute 0.5 0.0 - 0.5 K/uL   Basophils Relative 1 %   Basophils Absolute 0.1 0.0 - 0.1 K/uL   Immature Granulocytes 0 %   Abs Immature Granulocytes 0.02 0.00 - 0.07 K/uL   Imaging Studies: CT ABDOMEN PELVIS W CONTRAST  Result Date: 11/30/2022 CLINICAL DATA:  Right upper quadrant abdominal pain EXAM: CT ABDOMEN AND PELVIS WITH CONTRAST TECHNIQUE: Multidetector CT imaging of the abdomen and pelvis was performed using the standard protocol following bolus administration of intravenous contrast. RADIATION DOSE REDUCTION: This exam was performed according to the departmental dose-optimization program which includes automated exposure control, adjustment of the mA and/or kV according to patient size and/or use of iterative reconstruction technique. CONTRAST:  51mL OMNIPAQUE IOHEXOL 350 MG/ML SOLN COMPARISON:  None Available. FINDINGS: Lower chest: No acute abnormality. Hepatobiliary: No focal liver abnormality is seen. No gallstones, gallbladder wall thickening, or biliary dilatation. Pancreas: Unremarkable Spleen: Unremarkable Adrenals/Urinary Tract: Adrenal glands are unremarkable. Kidneys are normal, without renal calculi, focal lesion, or  hydronephrosis. Bladder is unremarkable. Stomach/Bowel: Stomach is within normal limits. Appendix appears normal. No evidence of bowel wall thickening, distention, or inflammatory changes. Vascular/Lymphatic: No significant vascular findings are present. No enlarged abdominal or pelvic lymph nodes. Reproductive: Prostate is unremarkable. Other: No abdominal wall hernia Musculoskeletal: No acute or significant osseous findings. IMPRESSION: 1. No acute intra-abdominal pathology identified. No definite radiographic explanation for the patient's reported symptoms. No abdominal wall  hernia identified. Electronically Signed   By: Fidela Salisbury M.D.   On: 11/30/2022 03:09    ED COURSE and MDM  Nursing notes, initial and subsequent vitals signs, including pulse oximetry, reviewed and interpreted by myself.  Vitals:   11/29/22 2249 11/30/22 0210 11/30/22 0230 11/30/22 0245  BP: (!) 137/99 (!) 138/102 (!) 135/98 (!) 131/97  Pulse: 81 (!) 57 (!) 56 (!) 57  Resp: 18 20 18 20   Temp: 98.4 F (36.9 C)  98 F (36.7 C)   SpO2: 100% 99% 98% 97%  Weight:      Height:       Medications  ondansetron (ZOFRAN) injection 4 mg (4 mg Intravenous Given 11/30/22 0213)  iohexol (OMNIPAQUE) 350 MG/ML injection 100 mL (75 mLs Intravenous Contrast Given 11/30/22 0256)   3:20 AM Patient advised of reassuring CT scan and laboratory studies.  Since he does lift at work he may have some abdominal wall pain.  Because his of his right upper quadrant tenderness however this could represent gallbladder pain that is not evident on the CT and we will have him return for an outpatient right upper quadrant ultrasound.   PROCEDURES  Procedures   ED DIAGNOSES     ICD-10-CM   1. RUQ abdominal pain  R10.11          Milam Allbaugh, MD 11/30/22 [phone]

## 2022-11-30 NOTE — ED Notes (Signed)
Patient back, mother at bedside. No needs voiced.

## 2022-11-30 NOTE — ED Notes (Signed)
Patient to CT.

## 2023-10-17 ENCOUNTER — Ambulatory Visit (HOSPITAL_COMMUNITY)
Admission: EM | Admit: 2023-10-17 | Discharge: 2023-10-17 | Disposition: A | Discharge status: Home / Self Care | Payer: Self-pay | Attending: Emergency Medicine | Admitting: Emergency Medicine

## 2023-10-17 ENCOUNTER — Encounter (HOSPITAL_COMMUNITY): Payer: Self-pay

## 2023-10-17 DIAGNOSIS — T7840XA Allergy, unspecified, initial encounter: Secondary | ICD-10-CM

## 2023-10-17 MED ORDER — FAMOTIDINE 20 MG PO TABS
ORAL_TABLET | ORAL | Status: AC
  Filled 2023-10-17: qty 1

## 2023-10-17 MED ORDER — METHYLPREDNISOLONE SODIUM SUCC 125 MG IJ SOLR
60.0000 mg | Freq: Once | INTRAMUSCULAR | Status: AC
  Administered 2023-10-17: 60 mg via INTRAMUSCULAR

## 2023-10-17 MED ORDER — FAMOTIDINE 20 MG PO TABS
20.0000 mg | ORAL_TABLET | Freq: Once | ORAL | Status: AC
  Administered 2023-10-17: 20 mg via ORAL

## 2023-10-17 MED ORDER — METHYLPREDNISOLONE SODIUM SUCC 125 MG IJ SOLR
INTRAMUSCULAR | Status: AC
  Filled 2023-10-17: qty 2

## 2023-10-17 NOTE — Discharge Instructions (Signed)
 If needed you can use benadryl every 6 hours for itching or rash If at any point you feel worse, or develop trouble swallowing or breathing, please return immediately or go to the emergency department.

## 2023-10-17 NOTE — ED Provider Notes (Signed)
 [facility]    CSN: [id number] Arrival date & time: 10/17/23  4098     History   Chief Complaint Chief Complaint  Patient presents with   Allergic Reaction    HPI Chad Chapman is a 28 y.o. male.  Here with allergic reaction His mom gave him a dose of amoxicillin about 1 hour ago 20 minutes after he started to feel itching all over his body and noticed hives Also feels tightness in the throat but denies trouble swallowing or breathing Not short of breath   He took a benadryl about 30 minutes ago Denies prior amox allergy   This was an old amoxicillin prescription that patient had leftover. His mom gave him one dose because she thought he had a sinus infection   Past Medical History:  Diagnosis Date   ADHD (attention deficit hyperactivity disorder)    Anxiety    Impetigo 06/20/2013   Recent, now resolved.   Urticaria     Patient Active Problem List   Diagnosis Date Noted   Patellar tendon avulsion, left, initial encounter 03/23/2018   MDD (major depressive disorder), single episode, moderate (HCC) 06/21/2013   ADHD (attention deficit hyperactivity disorder), combined type 06/21/2013    Past Surgical History:  Procedure Laterality Date   TONSILLECTOMY         Home Medications    Prior to Admission medications   Medication Sig Start Date End Date Taking? Authorizing Provider  diphenhydrAMINE (BENADRYL ALLERGY) 25 MG tablet Take 25 mg by mouth every 6 (six) hours as needed.    [provider]    Family History Family History  Problem Relation Age of Onset   Hypertension Mother    Healthy Father     Social History Social History   Tobacco Use   Smoking status: Some Days    Types: Cigarettes   Smokeless tobacco: Never  Vaping Use   Vaping status: Every Day   Substances: Nicotine, Flavoring  Substance Use Topics   Alcohol use: Yes    Alcohol/week: 14.0 standard drinks of alcohol    Types: 14 Cans of beer per week   Drug  use: Yes    Types: Marijuana    Comment: no cocaine since 11/23     Allergies   Patient has no known allergies.   Review of Systems Review of Systems Per HPI  Physical Exam Triage Vital Signs ED Triage Vitals [10/17/23 0917]  Encounter Vitals Group     BP (!) 137/93     Systolic BP Percentile      Diastolic BP Percentile      Pulse Rate 99     Resp 16     Temp      Temp src      SpO2 99 %     Weight      Height      Head Circumference      Peak Flow      Pain Score 0     Pain Loc      Pain Education      Exclude from Growth Chart    No data found.  Updated Vital Signs BP 115/71 (BP Location: Left Arm)   Pulse 78   Temp 97.8 F (36.6 C) (Oral)   Resp 16   SpO2 99%    Physical Exam Vitals and nursing note reviewed.  Constitutional:      General: He is not in acute distress.    Appearance: He is not  ill-appearing.  HENT:     Nose: No rhinorrhea.     Mouth/Throat:     Mouth: Mucous membranes are moist.     Pharynx: Oropharynx is clear. No pharyngeal swelling or posterior oropharyngeal erythema.     Comments: Patent airway. Speaks in full sentences  Neck:     Trachea: Trachea and phonation normal.  Cardiovascular:     Rate and Rhythm: Normal rate and regular rhythm.     Pulses: Normal pulses.     Heart sounds: Normal heart sounds.  Pulmonary:     Effort: Pulmonary effort is normal.     Breath sounds: Wheezing present.     Comments: Faint wheezing throughout. Patient in no acute distress with normal work of breathing Abdominal:     Palpations: Abdomen is soft.     Tenderness: There is no abdominal tenderness. There is no guarding.  Musculoskeletal:        General: Normal range of motion.     Cervical back: Normal range of motion. No rigidity or tenderness. No pain with movement.  Lymphadenopathy:     Cervical: No cervical adenopathy.  Skin:    Findings: Rash present.     Comments: Faint erythematous macules on arms, chest, back. Not urticarial   Neurological:     Mental Status: He is oriented to person, place, and time.     UC Treatments / Results  Labs (all labs ordered are listed, but only abnormal results are displayed) Labs Reviewed - No data to display  EKG  Radiology No results found.  Procedures Procedures  Medications Ordered in UC Medications  methylPREDNISolone sodium succinate (SOLU-MEDROL) 125 mg/2 mL injection 60 mg (60 mg Intramuscular Given 10/17/23 0930)  famotidine (PEPCID) tablet 20 mg (20 mg Oral Given 10/17/23 0929)    Initial Impression / Assessment and Plan / UC Course  I have reviewed the triage vital signs and the nursing notes.  Pertinent labs & imaging results that were available during my care of the patient were reviewed by me and considered in my medical decision making (see chart for details).  Well appearing, stable vitals, sating 99% room air He does have faint rash although does not appear to be hives Faint wheezing heard. Patient denies history of asthma. He does smoke marijuana and cigarettes, and vapes.  Although two body systems (wheezing with rash) he is overall very well appearing, no acute distress, we will defer epinephrine at this time.  Has already taken benadryl. Oral pepcid and IM solumedrol given.  Patient reports feeling much better. He has no trouble breathing or swallowing. The sensation in his throat has resolved. Feels he can take deep breaths without wheezing. Lungs are clear throughout. Monitored for a total of one hour after medicines given and patient is feeling well for discharge. Discussed monitoring symptoms, return and strict ED precautions. Patient agrees to plan  Final Clinical Impressions(s) / UC Diagnoses   Final diagnoses:  Allergic reaction, initial encounter     Discharge Instructions      If needed you can use benadryl every 6 hours for itching or rash If at any point you feel worse, or develop trouble swallowing or breathing, please return  immediately or go to the emergency department.      ED Prescriptions   None    PDMP not reviewed this encounter.   Alyra Patty, Lurena Joiner, PA-C 10/17/23 1101

## 2023-10-17 NOTE — ED Triage Notes (Addendum)
 Patient reports that he took an amoxicillin at 0830. Patient has hives and states trouble breathing. Patient states his "throat feels tight."  Patient states he has been having sinus issues prior to taking his mother's Amoxicillin.  Patient speaking in complete sentences.  Patient has swelling to bilateral neck. Patient states the right side is new, but had left neck swelling prior to taking the amoxicillin.  Patient states he took Benadryl 25 mg at 0845.
